# Patient Record
Sex: Male | Born: 1965 | Hispanic: No | Marital: Married
Health system: Southern US, Community
[De-identification: ages and names within clinical notes are randomized; demographics above are authoritative.]

---

## 2007-11-09 ENCOUNTER — Ambulatory Visit: Payer: Self-pay | Admitting: Internal Medicine

## 2007-11-25 ENCOUNTER — Inpatient Hospital Stay: Payer: Self-pay | Admitting: Internal Medicine

## 2007-11-25 ENCOUNTER — Ambulatory Visit: Payer: Self-pay | Admitting: Internal Medicine

## 2007-12-01 ENCOUNTER — Ambulatory Visit: Payer: Self-pay | Admitting: Internal Medicine

## 2007-12-09 ENCOUNTER — Ambulatory Visit: Payer: Self-pay | Admitting: Internal Medicine

## 2008-01-09 ENCOUNTER — Ambulatory Visit: Payer: Self-pay | Admitting: Internal Medicine

## 2008-02-09 ENCOUNTER — Ambulatory Visit: Payer: Self-pay | Admitting: Internal Medicine

## 2008-03-08 ENCOUNTER — Ambulatory Visit: Payer: Self-pay | Admitting: Internal Medicine

## 2008-04-08 ENCOUNTER — Ambulatory Visit: Payer: Self-pay | Admitting: Internal Medicine

## 2008-05-08 ENCOUNTER — Ambulatory Visit: Payer: Self-pay | Admitting: Internal Medicine

## 2008-06-08 ENCOUNTER — Ambulatory Visit: Payer: Self-pay | Admitting: Internal Medicine

## 2009-02-24 ENCOUNTER — Ambulatory Visit: Payer: Self-pay | Admitting: Vascular Surgery

## 2009-02-25 ENCOUNTER — Ambulatory Visit: Payer: Self-pay | Admitting: Vascular Surgery

## 2009-03-08 IMAGING — CR DG CHEST 2V
1 series · 3 of 3 positions shown · non-contrast
Comparison: none

REASON FOR EXAM: pleural effusion
COMMENTS:

[Series 1: view not recorded · 0.17mm/px · 3 of 3 slices shown]
[im 1/3]
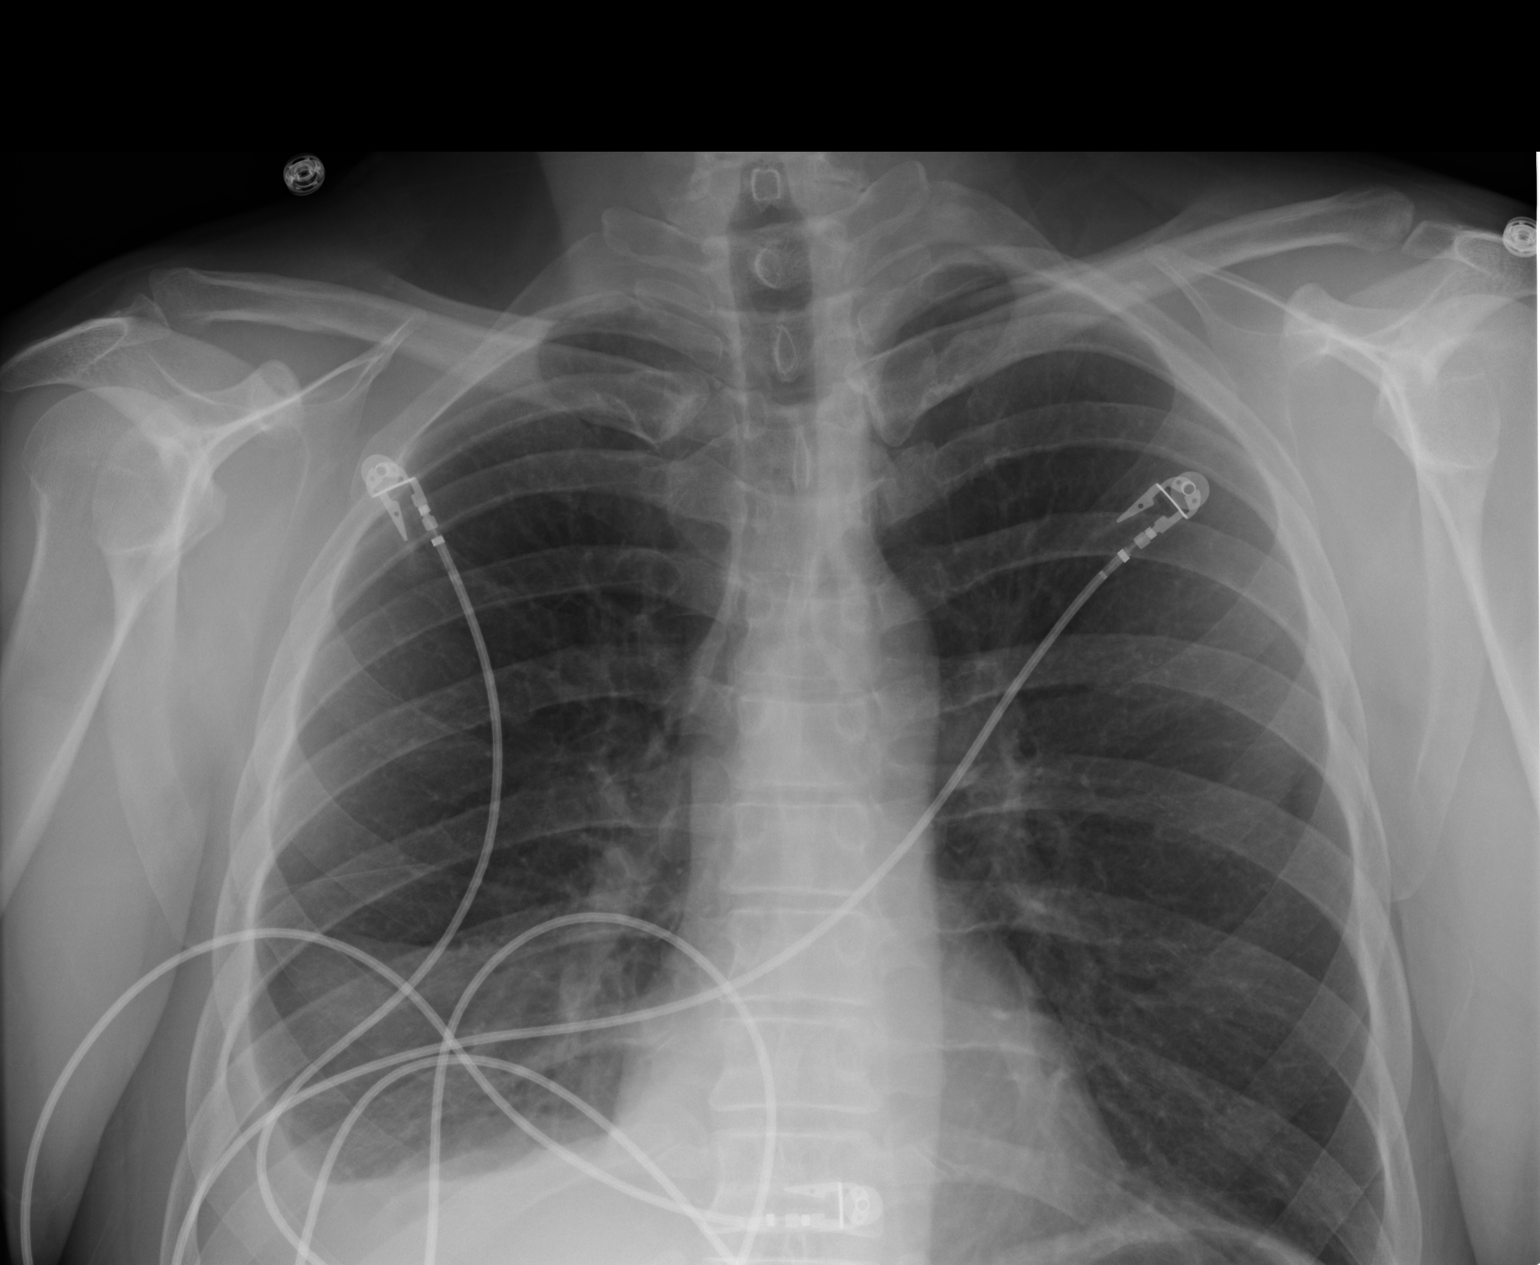
[im 2/3]
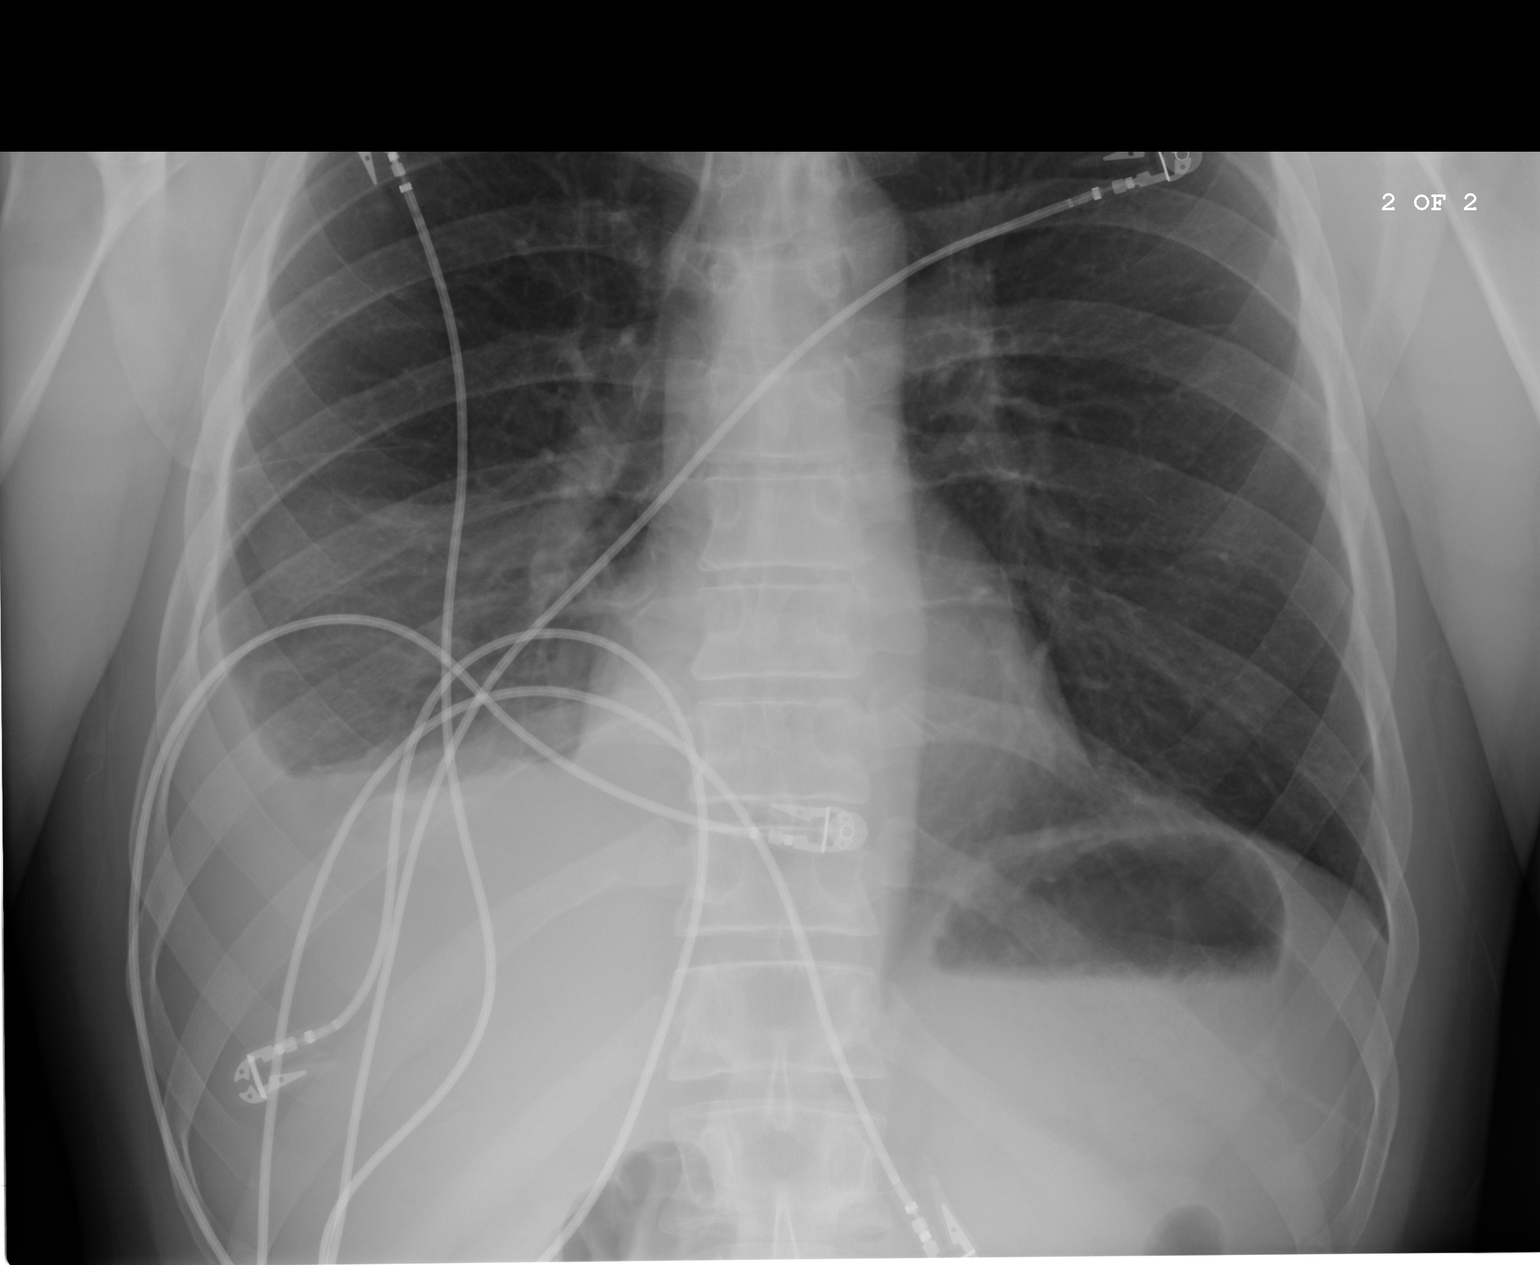
[im 3/3]
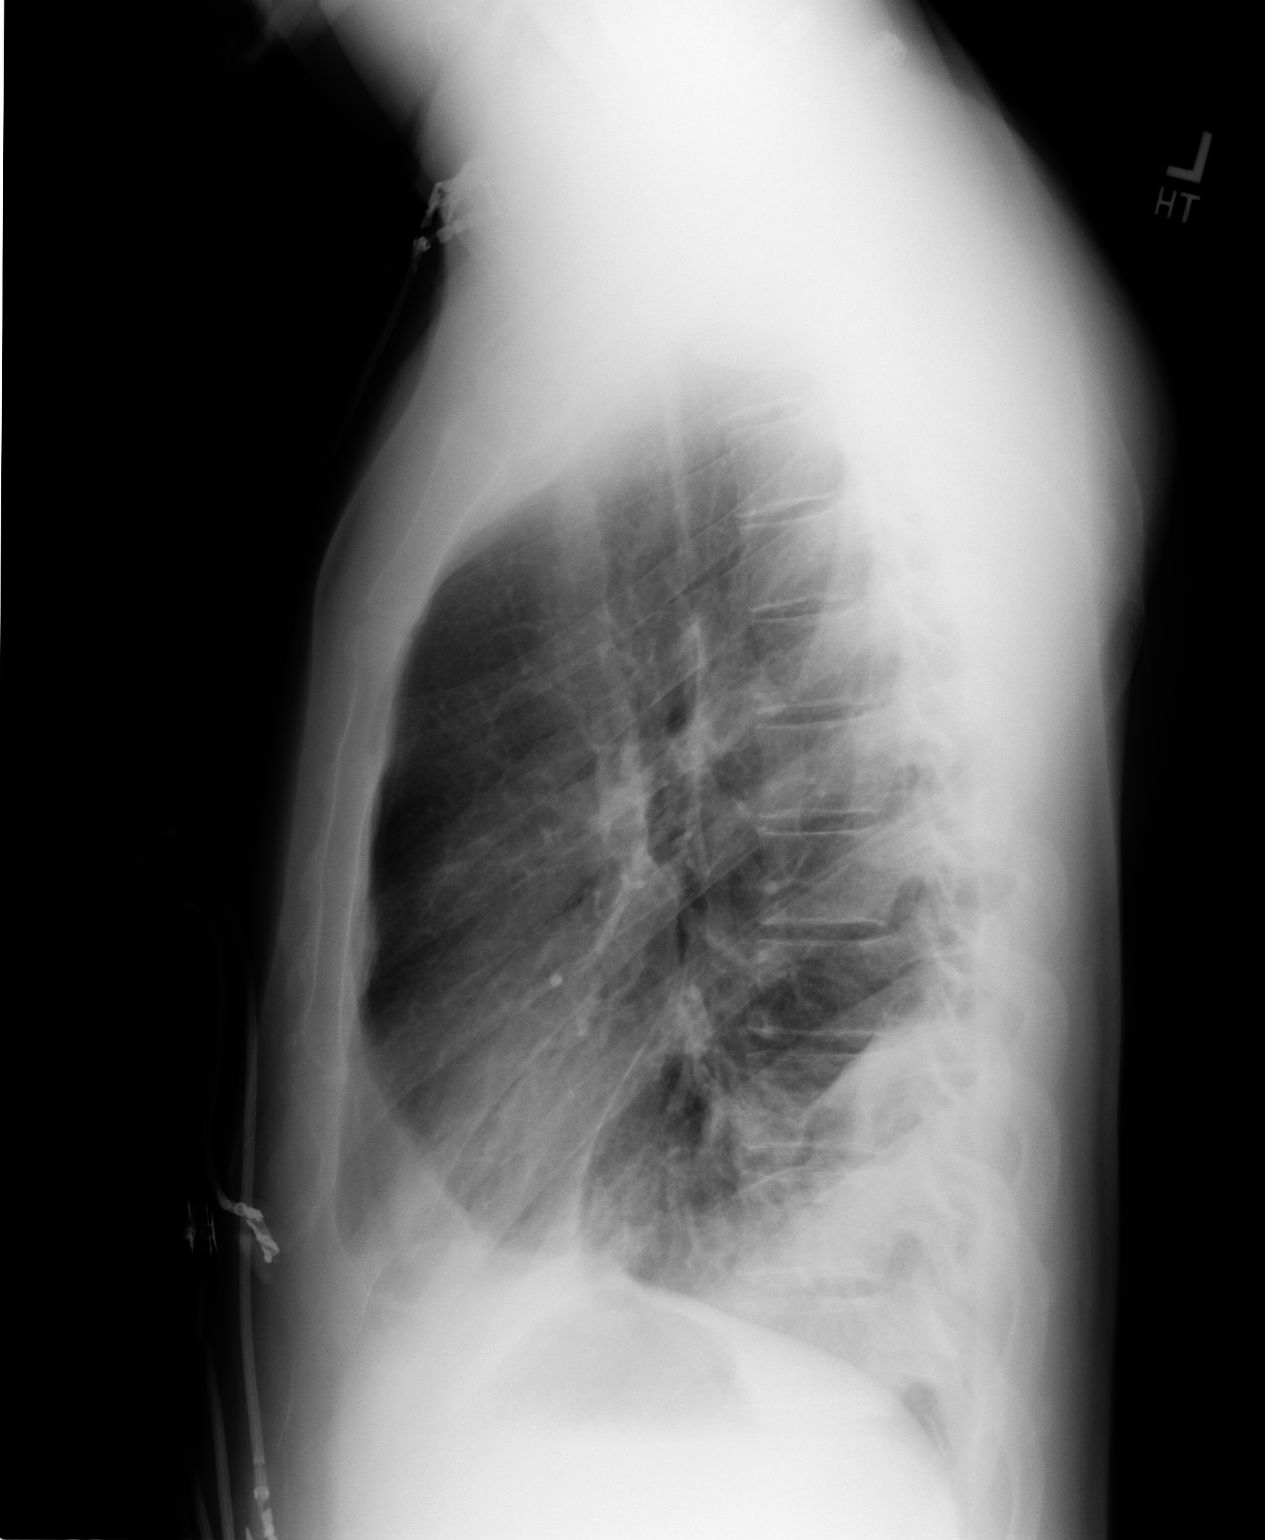

[3 of 3 positions shown; findings below may reference images not displayed]

PROCEDURE:     DXR - DXR CHEST PA (OR AP) AND LATERAL  - December 01, 2007 [DATE]

RESULT:     Comparison is made to a prior study dated 11/29/07.

A persistent small RIGHT effusion is appreciated. An area of increased
density within the RIGHT lung base has decreased in conspicuity.  No new
focal regions of consolidation are appreciated. The cardiac silhouette and
visualized bony skeleton are unremarkable.
IMPRESSION: RIGHT lower lobe infiltrate versus atelectasis as well as superimposed small
effusion.  No new focal region of consolidation are appreciated.

## 2013-04-23 ENCOUNTER — Emergency Department: Payer: Self-pay | Admitting: Internal Medicine

## 2013-04-30 ENCOUNTER — Ambulatory Visit: Payer: Self-pay | Admitting: Emergency Medicine

## 2015-11-14 ENCOUNTER — Telehealth (INDEPENDENT_AMBULATORY_CARE_PROVIDER_SITE_OTHER): Payer: Self-pay | Admitting: Vascular Surgery

## 2015-11-14 NOTE — Telephone Encounter (Signed)
Patient called and is concerned about a red spot on his calf. 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 He said the spot is a little swollen and painful. Wants to know if he should come in.

## 2015-11-14 NOTE — Telephone Encounter (Signed)
[facility] is going to [facility] today and will let us know if it is positive.

## 2019-03-21 ENCOUNTER — Ambulatory Visit: Payer: Self-pay
